# Patient Record
Sex: Male | Born: 1965 | Race: White | Hispanic: No | Marital: Married | Smoking: Never smoker
Health system: Southern US, Community
[De-identification: ages and names within clinical notes are randomized; demographics above are authoritative.]

## PROBLEM LIST (undated history)

## (undated) DIAGNOSIS — E119 Type 2 diabetes mellitus without complications: Secondary | ICD-10-CM

## (undated) DIAGNOSIS — R011 Cardiac murmur, unspecified: Secondary | ICD-10-CM

## (undated) DIAGNOSIS — R519 Headache, unspecified: Secondary | ICD-10-CM

## (undated) DIAGNOSIS — I10 Essential (primary) hypertension: Secondary | ICD-10-CM

## (undated) DIAGNOSIS — T4145XA Adverse effect of unspecified anesthetic, initial encounter: Secondary | ICD-10-CM

## (undated) DIAGNOSIS — M1990 Unspecified osteoarthritis, unspecified site: Secondary | ICD-10-CM

## (undated) DIAGNOSIS — T8859XA Other complications of anesthesia, initial encounter: Secondary | ICD-10-CM

## (undated) HISTORY — PX: SHOULDER ARTHROSCOPY: SHX128

## (undated) HISTORY — PX: KNEE ARTHROSCOPY: SUR90

---
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
## 1998-11-19 ENCOUNTER — Emergency Department (HOSPITAL_COMMUNITY): Admission: EM | Admit: 1998-11-19 | Discharge: 1998-11-19 | Payer: Self-pay | Admitting: Emergency Medicine

## 2017-08-03 HISTORY — PX: COLONOSCOPY: SHX174

## 2019-01-30 NOTE — Patient Instructions (Addendum)
Darin Harris     Your procedure is scheduled on: 02-06-2019   Report to [facility] Main  Entrance    Report to admitting at 925 AM   [place] 19 TEST ON 02-02-19  @ 10:00 AM, THIS TEST MUST BE DONE BEFORE SURGERY, COME TO [place]. ONCE YOUR COVID TEST IS COMPLETED, PLEASE BEGIN THE QUARANTINE INSTRUCTIONS AS OUTLINED IN YOUR HANDOUT.   Call this number if you have problems the morning of surgery [phone]    Remember: Do not eat food or drink liquids :After Midnight. BRUSH YOUR TEETH MORNING OF SURGERY AND RINSE YOUR MOUTH OUT, NO CHEWING GUM CANDY OR MINTS.     Take these medicines the morning of surgery with A SIP OF WATER: None                You may not have any metal on your body including hair pins and              piercings  Do not wear jewelry, make-up, lotions, powders or perfumes, deodorant                         Men may shave face and neck.   Do not bring valuables to the hospital. [place].  Contacts, dentures or bridgework may not be worn into surgery.  Leave suitcase in the car. After surgery it may be brought to your room.      _____________________________________________________________________  DO NOT TAKE ANY DIABETIC MEDICATIONS DAY OF YOUR SURGERY                How to Manage Your Diabetes Before and After Surgery  Why is it important to control my blood sugar before and after surgery? . Improving blood sugar levels before and after surgery helps healing and can limit problems. . A way of improving blood sugar control is eating a healthy diet by: o  Eating less sugar and carbohydrates o  Increasing activity/exercise o  Talking with your doctor about reaching your blood sugar goals . High blood sugars (greater than 180 mg/dL) can raise your risk of infections and slow your recovery, so you will need to focus on  controlling your diabetes during the weeks before surgery. . Make sure that the doctor who takes care of your diabetes knows about your planned surgery including the date and location.  How do I manage my blood sugar before surgery? . Check your blood sugar at least 4 times a day, starting 2 days before surgery, to make sure that the level is not too high or low. o Check your blood sugar the morning of your surgery when you wake up and every 2 hours until you get to the Short Stay unit. . If your blood sugar is less than 70 mg/dL, you will need to treat for low blood sugar: o Do not take insulin. o Treat a low blood sugar (less than 70 mg/dL) with  cup of clear juice (cranberry or apple), 4 glucose tablets, OR glucose gel. o Recheck blood sugar in 15 minutes after treatment (to make sure it is greater than 70 mg/dL). If your blood sugar is not greater than 70 mg/dL on recheck, call  [phone] for further instructions. . Report your blood sugar to the short stay nurse when you get to Short Stay.  . If you are admitted to the hospital after surgery: o Your blood sugar will be checked by the staff and you will probably be given insulin after surgery (instead of oral diabetes medicines) to make sure you have good blood sugar levels. o The goal for blood sugar control after surgery is 80-180 mg/dL.   WHAT DO I DO ABOUT MY DIABETES MEDICATION?  Marland Kitchen. Do not take oral diabetes medicines (pills) the morning of surgery.  . THE DAY  BEFORE SURGERY TAKE METFORMIN AS USUAL.        Reviewed and Endorsed by [facility] Patient Education Committee, August 2015             [facility] - Preparing for [facility] Before surgery, you can play an important role.  Because skin is not sterile, your skin needs to be as free of germs as possible.  You can reduce the number of germs on your skin by washing with CHG (chlorahexidine gluconate) soap before surgery.  CHG is an antiseptic cleaner which kills germs and  bonds with the skin to continue killing germs even after washing. Please DO NOT use if you have an allergy to CHG or antibacterial soaps.  If your skin becomes reddened/irritated stop using the CHG and inform your nurse when you arrive at Short Stay. Do not shave (including legs and underarms) for at least 48 hours prior to the first CHG shower.  You may shave your face/neck. Please follow these instructions carefully:  1.  Shower with CHG Soap the night before surgery and the  morning of Surgery.  2.  If you choose to wash your hair, wash your hair first as usual with your  normal  shampoo.  3.  After you shampoo, rinse your hair and body thoroughly to remove the  shampoo.                           4.  Use CHG as you would any other liquid soap.  You can apply chg directly  to the skin and wash                       Gently with a scrungie or clean washcloth.  5.  Apply the CHG Soap to your body ONLY FROM THE NECK DOWN.   Do not use on face/ open                           Wound or open sores. Avoid contact with eyes, ears mouth and genitals (private parts).                       Wash face,  Genitals (private parts) with your normal soap.             6.  Wash thoroughly, paying special attention to the area where your surgery  will be performed.  7.  Thoroughly rinse your body with warm water from the neck down.  8.  DO NOT shower/wash with your normal soap after using and rinsing off  the CHG Soap.                9.  Pat yourself dry with a clean towel.  10.  Wear clean pajamas.            11.  Place clean sheets on your bed the night of your first shower and do not  sleep with pets. Day of Surgery : Do not apply any lotions/deodorants the morning of surgery.  Please wear clean clothes to the hospital/surgery center.  FAILURE TO FOLLOW THESE INSTRUCTIONS MAY RESULT IN THE CANCELLATION OF YOUR SURGERY PATIENT SIGNATURE_________________________________  NURSE  SIGNATURE__________________________________  ________________________________________________________________________

## 2019-01-30 NOTE — Progress Notes (Signed)
NEED ORDERS IN Epic FOR 7-6 SURGERY. PRE OP IS 01-30-09 AT 800 AM.

## 2019-01-31 ENCOUNTER — Encounter (HOSPITAL_COMMUNITY)
Admission: RE | Admit: 2019-01-31 | Discharge: 2019-01-31 | Disposition: A | Discharge status: Home / Self Care | Payer: BC Managed Care – PPO | Source: Ambulatory Visit | Attending: Orthopedic Surgery | Admitting: Orthopedic Surgery

## 2019-01-31 ENCOUNTER — Encounter (HOSPITAL_COMMUNITY): Payer: Self-pay

## 2019-01-31 ENCOUNTER — Encounter (INDEPENDENT_AMBULATORY_CARE_PROVIDER_SITE_OTHER): Payer: Self-pay

## 2019-01-31 ENCOUNTER — Other Ambulatory Visit: Payer: Self-pay

## 2019-01-31 DIAGNOSIS — Z01818 Encounter for other preprocedural examination: Secondary | ICD-10-CM | POA: Diagnosis present

## 2019-01-31 HISTORY — DX: Unspecified osteoarthritis, unspecified site: M19.90

## 2019-01-31 HISTORY — DX: Essential (primary) hypertension: I10

## 2019-01-31 HISTORY — DX: Cardiac murmur, unspecified: R01.1

## 2019-01-31 HISTORY — DX: Other complications of anesthesia, initial encounter: T88.59XA

## 2019-01-31 HISTORY — DX: Headache, unspecified: R51.9

## 2019-01-31 HISTORY — DX: Type 2 diabetes mellitus without complications: E11.9

## 2019-01-31 LAB — CBC
HCT: 50.7 % (ref 39.0–52.0)
Hemoglobin: 16.8 g/dL (ref 13.0–17.0)
MCH: 29.3 pg (ref 26.0–34.0)
MCHC: 33.1 g/dL (ref 30.0–36.0)
MCV: 88.3 fL (ref 80.0–100.0)
Platelets: 277 10*3/uL (ref 150–400)
RBC: 5.74 MIL/uL (ref 4.22–5.81)
RDW: 12.1 % (ref 11.5–15.5)
WBC: 6 10*3/uL (ref 4.0–10.5)
nRBC: 0 % (ref 0.0–0.2)

## 2019-01-31 LAB — SURGICAL PCR SCREEN
MRSA, PCR: NEGATIVE
Staphylococcus aureus: NEGATIVE

## 2019-01-31 LAB — BASIC METABOLIC PANEL
Anion gap: 8 (ref 5–15)
BUN: 24 mg/dL — ABNORMAL HIGH (ref 6–20)
CO2: 26 mmol/L (ref 22–32)
Calcium: 9.1 mg/dL (ref 8.9–10.3)
Chloride: 106 mmol/L (ref 98–111)
Creatinine, Ser: 0.97 mg/dL (ref 0.61–1.24)
GFR calc Af Amer: >60 mL/min (ref >60)
GFR calc non Af Amer: >60 mL/min (ref >60)
Glucose, Bld: 110 mg/dL — ABNORMAL HIGH (ref 70–99)
Potassium: 4.4 mmol/L (ref 3.5–5.1)
Sodium: 140 mmol/L (ref 135–145)

## 2019-01-31 LAB — HEMOGLOBIN A1C
Hgb A1c MFr Bld: 5.7 % — ABNORMAL HIGH (ref 4.8–5.6)
Mean Plasma Glucose: 116.89 mg/dL

## 2019-01-31 LAB — GLUCOSE, CAPILLARY: Glucose-Capillary: 98 mg/dL (ref 70–99)

## 2019-01-31 NOTE — H&P (Signed)
Patient's anticipated LOS is less than 2 midnights, meeting these requirements: - Younger than [id number] - Lives within 1 hour of care - Has a competent adult at home to recover with post-op recover - NO history of  - Chronic pain requiring opiods  - Diabetes  - Coronary Artery Disease  - Heart failure  - Heart attack  - Stroke  - DVT/VTE  - Cardiac arrhythmia  - Respiratory Failure/COPD  - Renal failure  - Anemia  - Advanced Liver disease       Darin Harris is an 53 y.o. male.    Chief Complaint: right knee pain  HPI: Pt is a 53 y.o. male complaining of right knee pain for multiple years. Pain had continually increased since the beginning. X-rays in the clinic show end-stage arthritic changes of the right knee. Pt has tried various conservative treatments which have failed to alleviate their symptoms, including injections and therapy. Various options are discussed with the patient. Risks, benefits and expectations were discussed with the patient. Patient understand the risks, benefits and expectations and wishes to proceed with surgery.   PCP:  Tracey HarriesBouska, David, MD  D/C Plans: Home  PMH: Past Medical History:  Diagnosis Date  . Arthritis   . Complication of anesthesia    Vertigo  . Diabetes mellitus without complication (HCC)   . Headache   . Heart murmur   . Hypertension     PSH: Past Surgical History:  Procedure Laterality Date  . COLONOSCOPY  2019  . KNEE ARTHROSCOPY Bilateral   . SHOULDER ARTHROSCOPY Bilateral     Social History:  reports that he has never smoked. He has never used smokeless tobacco. He reports that he does not drink alcohol or use drugs.  Allergies:  Allergies  Allergen Reactions  . Oxycodone Nausea And Vomiting    Medications: No current facility-administered medications for this encounter.    Current Outpatient Medications  Medication Sig Dispense Refill  . metFORMIN (GLUCOPHAGE-XR) 500 MG 24 hr tablet Take 500 mg by mouth 2  (two) times a day.      Results for orders placed or performed during the hospital encounter of 01/31/19 (from the past 48 hour(s))  Glucose, capillary     Status: None   Collection Time: 01/31/19  8:26 AM  Result Value Ref Range   Glucose-Capillary 98 70 - 99 mg/dL  Surgical pcr screen     Status: None   Collection Time: 01/31/19  9:22 AM   Specimen: Nasal Mucosa; Nasal Swab  Result Value Ref Range   MRSA, PCR NEGATIVE NEGATIVE   Staphylococcus aureus NEGATIVE NEGATIVE    Comment: (NOTE) The Xpert SA Assay (FDA approved for NASAL specimens in patients 51 years of age and older), is one component of a comprehensive surveillance program. It is not intended to diagnose infection nor to guide or monitor treatment. Performed at [facility], [address] [place], [place] [address]   Hemoglobin A1c     Status: Abnormal   Collection Time: 01/31/19  9:22 AM  Result Value Ref Range   Hgb A1c MFr Bld 5.7 (H) 4.8 - 5.6 %    Comment: (NOTE) Pre diabetes:          5.7%-6.4% Diabetes:              >6.4% Glycemic control for   <7.0% adults with diabetes    Mean Plasma Glucose 116.89 mg/dL    Comment: Performed at [facility], [address] [place], [place] [address]  Basic metabolic panel     Status: Abnormal   Collection Time: 01/31/19  9:22 AM  Result Value Ref Range   Sodium 140 135 - 145 mmol/L   Potassium 4.4 3.5 - 5.1 mmol/L   Chloride 106 98 - 111 mmol/L   CO2 26 22 - 32 mmol/L   Glucose, Bld 110 (H) 70 - 99 mg/dL   BUN 24 (H) 6 - 20 mg/dL   Creatinine, Ser 0.97 0.61 - 1.24 mg/dL   Calcium 9.1 8.9 - 10.3 mg/dL   GFR calc non Af Amer >60 >60 mL/min   GFR calc Af Amer >60 >60 mL/min   Anion gap 8 5 - 15    Comment: Performed at [facility], [place] [address] [place], [place] [address]  CBC     Status: None   Collection Time: 01/31/19  9:22 AM  Result Value Ref Range   WBC 6.0 4.0 - 10.5 K/uL   RBC 5.74 4.22 - 5.81 MIL/uL    Hemoglobin 16.8 13.0 - 17.0 g/dL   HCT 50.7 39.0 - 52.0 %   MCV 88.3 80.0 - 100.0 fL   MCH 29.3 26.0 - 34.0 pg   MCHC 33.1 30.0 - 36.0 g/dL   RDW 12.1 11.5 - 15.5 %   Platelets 277 150 - 400 K/uL   nRBC 0.0 0.0 - 0.2 %    Comment: Performed at [facility], [place] [address] [place], [place] [address]   No results found.  ROS: Pain with rom of the right lower extremity  Physical Exam: Alert and oriented 53 y.o. male in no acute distress Cranial nerves 2-12 intact Cervical spine: full rom with no tenderness, nv intact distally Chest: active breath sounds bilaterally, no wheeze rhonchi or rales Heart: regular rate and rhythm, no murmur Abd: non tender non distended with active bowel sounds Hip is stable with rom  Right knee medial and lateral joint line tenderness nv intact distally Antalgic gait  Assessment/Plan Assessment: right knee end stage osteoarthritis  Plan:  Patient will undergo a right total knee by Dr. Veverly Fells at [facility]. Risks benefits and expectations were discussed with the patient. Patient understand risks, benefits and expectations and wishes to proceed. Preoperative templating of the joint replacement has been completed, documented, and submitted to the Operating Room personnel in order to optimize intra-operative equipment management.   Merla Riches PA-C, MPAS [facility] is now [employer] [address] [place], [place], [place] [address] Phone: [phone] [email or web address] Facebook  [employer]

## 2019-02-02 ENCOUNTER — Other Ambulatory Visit (HOSPITAL_COMMUNITY)
Admission: RE | Admit: 2019-02-02 | Discharge: 2019-02-02 | Disposition: A | Discharge status: Home / Self Care | Payer: BC Managed Care – PPO | Source: Ambulatory Visit | Attending: Orthopedic Surgery | Admitting: Orthopedic Surgery

## 2019-02-02 DIAGNOSIS — Z1159 Encounter for screening for other viral diseases: Secondary | ICD-10-CM | POA: Insufficient documentation

## 2019-02-02 LAB — SARS CORONAVIRUS 2 (TAT 6-24 HRS): SARS Coronavirus 2: NEGATIVE

## 2019-02-06 ENCOUNTER — Other Ambulatory Visit: Payer: Self-pay

## 2019-02-06 ENCOUNTER — Encounter (HOSPITAL_COMMUNITY): Payer: Self-pay

## 2019-02-06 ENCOUNTER — Observation Stay (HOSPITAL_COMMUNITY)
Admission: RE | Admit: 2019-02-06 | Discharge: 2019-02-07 | Disposition: A | Discharge status: Home / Self Care | Payer: BC Managed Care – PPO | Attending: Orthopedic Surgery | Admitting: Orthopedic Surgery

## 2019-02-06 ENCOUNTER — Inpatient Hospital Stay (HOSPITAL_COMMUNITY): Payer: BC Managed Care – PPO | Admitting: Anesthesiology

## 2019-02-06 ENCOUNTER — Inpatient Hospital Stay (HOSPITAL_COMMUNITY): Payer: BC Managed Care – PPO | Admitting: Physician Assistant

## 2019-02-06 ENCOUNTER — Encounter (HOSPITAL_COMMUNITY)
Admission: RE | Disposition: A | Discharge status: Home / Self Care | Payer: Self-pay | Source: Home / Self Care | Attending: Orthopedic Surgery

## 2019-02-06 ENCOUNTER — Observation Stay (HOSPITAL_COMMUNITY): Payer: BC Managed Care – PPO

## 2019-02-06 DIAGNOSIS — I10 Essential (primary) hypertension: Secondary | ICD-10-CM | POA: Diagnosis not present

## 2019-02-06 DIAGNOSIS — E119 Type 2 diabetes mellitus without complications: Secondary | ICD-10-CM | POA: Diagnosis not present

## 2019-02-06 DIAGNOSIS — Z6835 Body mass index (BMI) 35.0-35.9, adult: Secondary | ICD-10-CM | POA: Diagnosis not present

## 2019-02-06 DIAGNOSIS — E669 Obesity, unspecified: Secondary | ICD-10-CM | POA: Diagnosis not present

## 2019-02-06 DIAGNOSIS — Z79899 Other long term (current) drug therapy: Secondary | ICD-10-CM | POA: Diagnosis not present

## 2019-02-06 DIAGNOSIS — Z7984 Long term (current) use of oral hypoglycemic drugs: Secondary | ICD-10-CM | POA: Diagnosis not present

## 2019-02-06 DIAGNOSIS — M1711 Unilateral primary osteoarthritis, right knee: Principal | ICD-10-CM | POA: Insufficient documentation

## 2019-02-06 DIAGNOSIS — Z96651 Presence of right artificial knee joint: Secondary | ICD-10-CM

## 2019-02-06 HISTORY — PX: TOTAL KNEE ARTHROPLASTY: SHX125

## 2019-02-06 LAB — GLUCOSE, CAPILLARY
Glucose-Capillary: 103 mg/dL — ABNORMAL HIGH (ref 70–99)
Glucose-Capillary: 110 mg/dL — ABNORMAL HIGH (ref 70–99)

## 2019-02-06 SURGERY — ARTHROPLASTY, KNEE, TOTAL
Anesthesia: Spinal | Laterality: Right

## 2019-02-06 MED ORDER — FENTANYL CITRATE (PF) 100 MCG/2ML IJ SOLN
50.0000 ug | INTRAMUSCULAR | Status: DC
  Administered 2019-02-06: 50 ug via INTRAVENOUS
  Filled 2019-02-06: qty 2

## 2019-02-06 MED ORDER — BISACODYL 10 MG RE SUPP: 10.0000 mg | Freq: Every day | RECTAL | Status: DC | PRN

## 2019-02-06 MED ORDER — MENTHOL 3 MG MT LOZG: 1.0000 | LOZENGE | OROMUCOSAL | Status: DC | PRN

## 2019-02-06 MED ORDER — PROMETHAZINE HCL 25 MG/ML IJ SOLN: 6.2500 mg | INTRAMUSCULAR | Status: DC | PRN

## 2019-02-06 MED ORDER — MIDAZOLAM HCL 2 MG/2ML IJ SOLN
1.0000 mg | INTRAMUSCULAR | Status: DC
  Administered 2019-02-06: 11:00:00 2 mg via INTRAVENOUS
  Filled 2019-02-06: qty 2

## 2019-02-06 MED ORDER — DEXAMETHASONE SODIUM PHOSPHATE 10 MG/ML IJ SOLN
INTRAMUSCULAR | Status: DC | PRN
  Administered 2019-02-06: 8 mg via INTRAVENOUS

## 2019-02-06 MED ORDER — LACTATED RINGERS IV SOLN
INTRAVENOUS | Status: DC
  Administered 2019-02-06 (×2): via INTRAVENOUS

## 2019-02-06 MED ORDER — SODIUM CHLORIDE 0.9 % IV SOLN
INTRAVENOUS | Status: DC
  Administered 2019-02-06: 18:00:00 via INTRAVENOUS

## 2019-02-06 MED ORDER — CHLORHEXIDINE GLUCONATE 4 % EX LIQD: 60.0000 mL | Freq: Once | CUTANEOUS | Status: DC

## 2019-02-06 MED ORDER — 0.9 % SODIUM CHLORIDE (POUR BTL) OPTIME
TOPICAL | Status: DC | PRN
  Administered 2019-02-06: 12:00:00 1000 mL

## 2019-02-06 MED ORDER — SODIUM CHLORIDE 0.9 % IR SOLN
Status: DC | PRN
  Administered 2019-02-06: 3000 mL

## 2019-02-06 MED ORDER — OXYCODONE-ACETAMINOPHEN 5-325 MG PO TABS: 1.0000 | ORAL_TABLET | ORAL | 0 refills | Status: AC | PRN

## 2019-02-06 MED ORDER — INSULIN ASPART 100 UNIT/ML ~~LOC~~ SOLN
0.0000 [IU] | Freq: Three times a day (TID) | SUBCUTANEOUS | Status: DC
  Administered 2019-02-07: 08:00:00 2 [IU] via SUBCUTANEOUS

## 2019-02-06 MED ORDER — ROPIVACAINE HCL 7.5 MG/ML IJ SOLN
INTRAMUSCULAR | Status: DC | PRN
  Administered 2019-02-06: 20 mL via PERINEURAL

## 2019-02-06 MED ORDER — ONDANSETRON HCL 4 MG/2ML IJ SOLN
INTRAMUSCULAR | Status: AC
  Filled 2019-02-06: qty 2

## 2019-02-06 MED ORDER — EPHEDRINE 5 MG/ML INJ
INTRAVENOUS | Status: AC
  Filled 2019-02-06: qty 10

## 2019-02-06 MED ORDER — ACETAMINOPHEN 325 MG PO TABS: 325.0000 mg | ORAL_TABLET | Freq: Four times a day (QID) | ORAL | Status: DC | PRN

## 2019-02-06 MED ORDER — ASPIRIN 81 MG PO CHEW
81.0000 mg | CHEWABLE_TABLET | Freq: Two times a day (BID) | ORAL | Status: DC
  Administered 2019-02-06 – 2019-02-07 (×2): 81 mg via ORAL
  Filled 2019-02-06 (×2): qty 1

## 2019-02-06 MED ORDER — TRANEXAMIC ACID-NACL 1000-0.7 MG/100ML-% IV SOLN
1000.0000 mg | Freq: Once | INTRAVENOUS | Status: AC
  Administered 2019-02-06: 1000 mg via INTRAVENOUS
  Filled 2019-02-06: qty 100

## 2019-02-06 MED ORDER — METOCLOPRAMIDE HCL 5 MG PO TABS: 5.0000 mg | ORAL_TABLET | Freq: Three times a day (TID) | ORAL | Status: DC | PRN

## 2019-02-06 MED ORDER — POLYETHYLENE GLYCOL 3350 17 G PO PACK: 17.0000 g | PACK | Freq: Every day | ORAL | Status: DC | PRN

## 2019-02-06 MED ORDER — ONDANSETRON HCL 4 MG PO TABS: 4.0000 mg | ORAL_TABLET | Freq: Four times a day (QID) | ORAL | Status: DC | PRN

## 2019-02-06 MED ORDER — FENTANYL CITRATE (PF) 100 MCG/2ML IJ SOLN
25.0000 ug | INTRAMUSCULAR | Status: DC | PRN
  Administered 2019-02-06 (×3): 50 ug via INTRAVENOUS

## 2019-02-06 MED ORDER — METOCLOPRAMIDE HCL 5 MG/ML IJ SOLN: 5.0000 mg | Freq: Three times a day (TID) | INTRAMUSCULAR | Status: DC | PRN

## 2019-02-06 MED ORDER — TRAMADOL HCL 50 MG PO TABS
50.0000 mg | ORAL_TABLET | Freq: Four times a day (QID) | ORAL | Status: DC
  Administered 2019-02-06 – 2019-02-07 (×4): 50 mg via ORAL
  Filled 2019-02-06 (×4): qty 1

## 2019-02-06 MED ORDER — DOCUSATE SODIUM 100 MG PO CAPS
100.0000 mg | ORAL_CAPSULE | Freq: Two times a day (BID) | ORAL | Status: DC
  Administered 2019-02-06 – 2019-02-07 (×2): 100 mg via ORAL
  Filled 2019-02-06 (×2): qty 1

## 2019-02-06 MED ORDER — METFORMIN HCL ER 500 MG PO TB24
500.0000 mg | ORAL_TABLET | Freq: Two times a day (BID) | ORAL | Status: DC
  Administered 2019-02-07: 500 mg via ORAL
  Filled 2019-02-06: qty 1

## 2019-02-06 MED ORDER — HYDROMORPHONE HCL 1 MG/ML IJ SOLN
0.5000 mg | INTRAMUSCULAR | Status: DC | PRN
  Administered 2019-02-07: 01:00:00 1 mg via INTRAVENOUS
  Filled 2019-02-06: qty 1

## 2019-02-06 MED ORDER — ONDANSETRON HCL 4 MG PO TABS: 4.0000 mg | ORAL_TABLET | Freq: Three times a day (TID) | ORAL | 1 refills | Status: AC | PRN

## 2019-02-06 MED ORDER — PROPOFOL 500 MG/50ML IV EMUL
INTRAVENOUS | Status: DC | PRN
  Administered 2019-02-06: 50 ug/kg/min via INTRAVENOUS

## 2019-02-06 MED ORDER — PHENOL 1.4 % MT LIQD
1.0000 | OROMUCOSAL | Status: DC | PRN
  Filled 2019-02-06: qty 177

## 2019-02-06 MED ORDER — PROPOFOL 10 MG/ML IV BOLUS
INTRAVENOUS | Status: AC
  Filled 2019-02-06: qty 40

## 2019-02-06 MED ORDER — BUPIVACAINE IN DEXTROSE 0.75-8.25 % IT SOLN
INTRATHECAL | Status: DC | PRN
  Administered 2019-02-06: 1.6 mL via INTRATHECAL

## 2019-02-06 MED ORDER — ONDANSETRON HCL 4 MG/2ML IJ SOLN
4.0000 mg | Freq: Four times a day (QID) | INTRAMUSCULAR | Status: DC | PRN
  Administered 2019-02-07 (×2): 4 mg via INTRAVENOUS
  Filled 2019-02-06 (×2): qty 2

## 2019-02-06 MED ORDER — INSULIN ASPART 100 UNIT/ML ~~LOC~~ SOLN: 0.0000 [IU] | Freq: Every day | SUBCUTANEOUS | Status: DC

## 2019-02-06 MED ORDER — METHOCARBAMOL 500 MG IVPB - SIMPLE MED
500.0000 mg | Freq: Four times a day (QID) | INTRAVENOUS | Status: DC | PRN
  Administered 2019-02-06: 500 mg via INTRAVENOUS
  Filled 2019-02-06: qty 50

## 2019-02-06 MED ORDER — METHOCARBAMOL 500 MG PO TABS
500.0000 mg | ORAL_TABLET | Freq: Four times a day (QID) | ORAL | Status: DC | PRN
  Administered 2019-02-06 – 2019-02-07 (×2): 500 mg via ORAL
  Filled 2019-02-06 (×2): qty 1

## 2019-02-06 MED ORDER — EPHEDRINE SULFATE-NACL 50-0.9 MG/10ML-% IV SOSY
PREFILLED_SYRINGE | INTRAVENOUS | Status: DC | PRN
  Administered 2019-02-06: 5 mg via INTRAVENOUS

## 2019-02-06 MED ORDER — TRANEXAMIC ACID-NACL 1000-0.7 MG/100ML-% IV SOLN
1000.0000 mg | INTRAVENOUS | Status: AC
  Administered 2019-02-06: 1000 mg via INTRAVENOUS
  Filled 2019-02-06: qty 100

## 2019-02-06 MED ORDER — PROPOFOL 10 MG/ML IV BOLUS
INTRAVENOUS | Status: DC | PRN
  Administered 2019-02-06: 20 mg via INTRAVENOUS

## 2019-02-06 MED ORDER — METHOCARBAMOL 500 MG IVPB - SIMPLE MED
INTRAVENOUS | Status: AC
  Administered 2019-02-06: 500 mg via INTRAVENOUS
  Filled 2019-02-06: qty 50

## 2019-02-06 MED ORDER — DEXAMETHASONE SODIUM PHOSPHATE 10 MG/ML IJ SOLN
INTRAMUSCULAR | Status: AC
  Filled 2019-02-06: qty 1

## 2019-02-06 MED ORDER — ONDANSETRON HCL 4 MG/2ML IJ SOLN
INTRAMUSCULAR | Status: DC | PRN
  Administered 2019-02-06: 4 mg via INTRAVENOUS

## 2019-02-06 MED ORDER — CEFAZOLIN SODIUM-DEXTROSE 2-4 GM/100ML-% IV SOLN
2.0000 g | Freq: Four times a day (QID) | INTRAVENOUS | Status: AC
  Administered 2019-02-06 (×2): 2 g via INTRAVENOUS
  Filled 2019-02-06 (×2): qty 100

## 2019-02-06 MED ORDER — ASPIRIN 81 MG PO CHEW: 81.0000 mg | CHEWABLE_TABLET | Freq: Two times a day (BID) | ORAL | 0 refills | Status: AC

## 2019-02-06 MED ORDER — FENTANYL CITRATE (PF) 100 MCG/2ML IJ SOLN
INTRAMUSCULAR | Status: AC
  Administered 2019-02-06: 50 ug via INTRAVENOUS
  Filled 2019-02-06: qty 2

## 2019-02-06 MED ORDER — OXYCODONE HCL 5 MG PO TABS
5.0000 mg | ORAL_TABLET | ORAL | Status: DC | PRN
  Administered 2019-02-07: 10 mg via ORAL
  Filled 2019-02-06: qty 2

## 2019-02-06 MED ORDER — METHOCARBAMOL 500 MG PO TABS: 500.0000 mg | ORAL_TABLET | Freq: Four times a day (QID) | ORAL | 1 refills | Status: AC | PRN

## 2019-02-06 MED ORDER — FERROUS SULFATE 325 (65 FE) MG PO TABS
325.0000 mg | ORAL_TABLET | Freq: Three times a day (TID) | ORAL | Status: DC
  Administered 2019-02-06 – 2019-02-07 (×3): 325 mg via ORAL
  Filled 2019-02-06 (×3): qty 1

## 2019-02-06 MED ORDER — CEFAZOLIN SODIUM-DEXTROSE 2-4 GM/100ML-% IV SOLN
2.0000 g | INTRAVENOUS | Status: AC
  Administered 2019-02-06: 2 g via INTRAVENOUS
  Filled 2019-02-06: qty 100

## 2019-02-06 MED ORDER — WATER FOR IRRIGATION, STERILE IR SOLN
Status: DC | PRN
  Administered 2019-02-06: 2000 mL

## 2019-02-06 MED ORDER — FENTANYL CITRATE (PF) 100 MCG/2ML IJ SOLN
INTRAMUSCULAR | Status: AC
  Administered 2019-02-06: 15:00:00 50 ug via INTRAVENOUS
  Filled 2019-02-06: qty 2

## 2019-02-06 SURGICAL SUPPLY — 63 items
BAG SPEC THK2 15X12 ZIP CLS (MISCELLANEOUS) ×1
BAG ZIPLOCK 12X15 (MISCELLANEOUS) ×3 IMPLANT
BIT DRILL 1.6MX128 (BIT) ×2 IMPLANT
BIT DRILL 1.6MX128MM (BIT) ×1
BLADE SAG 18X100X1.27 (BLADE) ×3 IMPLANT
BLADE SAW SGTL 13X75X1.27 (BLADE) ×3 IMPLANT
BNDG CMPR 82X61 PLY HI ABS (GAUZE/BANDAGES/DRESSINGS) ×1
BNDG CMPR MED 10X6 ELC LF (GAUZE/BANDAGES/DRESSINGS) ×1
BNDG CMPR MED 15X6 ELC VLCR LF (GAUZE/BANDAGES/DRESSINGS) ×1
BNDG CONFORM 6X.82 1P STRL (GAUZE/BANDAGES/DRESSINGS) ×3 IMPLANT
BNDG ELASTIC 6X10 VLCR STRL LF (GAUZE/BANDAGES/DRESSINGS) ×3 IMPLANT
BNDG ELASTIC 6X15 VLCR STRL LF (GAUZE/BANDAGES/DRESSINGS) ×3 IMPLANT
BNDG GAUZE ELAST 4 BULKY (GAUZE/BANDAGES/DRESSINGS) ×3 IMPLANT
BOWL SMART MIX CTS (DISPOSABLE) ×3 IMPLANT
CEMENT HV SMART SET (Cement) ×6 IMPLANT
CEMENT TIBIA MBT SIZE 5 (Knees) ×1 IMPLANT
CLOSURE WOUND 1/2 X4 (GAUZE/BANDAGES/DRESSINGS) ×2
COVER SURGICAL LIGHT HANDLE (MISCELLANEOUS) ×3 IMPLANT
COVER WAND RF STERILE (DRAPES) IMPLANT
CUFF TOURN SGL QUICK 34 (TOURNIQUET CUFF) ×3
CUFF TOURN SGL QUICK 42 (TOURNIQUET CUFF) ×3 IMPLANT
CUFF TRNQT CYL 34X4.125X (TOURNIQUET CUFF) ×1 IMPLANT
DRAPE SHEET LG 3/4 BI-LAMINATE (DRAPES) ×3 IMPLANT
DRAPE U-SHAPE 47X51 STRL (DRAPES) ×3 IMPLANT
DRSG ADAPTIC 3X8 NADH LF (GAUZE/BANDAGES/DRESSINGS) ×3 IMPLANT
DRSG PAD ABDOMINAL 8X10 ST (GAUZE/BANDAGES/DRESSINGS) ×3 IMPLANT
DURAPREP 26ML APPLICATOR (WOUND CARE) ×3 IMPLANT
ELECT REM PT RETURN 15FT ADLT (MISCELLANEOUS) ×3 IMPLANT
FEMUR SIGMA PS SZ 5.0 R (Femur) ×3 IMPLANT
GAUZE SPONGE 4X4 12PLY STRL (GAUZE/BANDAGES/DRESSINGS) ×3 IMPLANT
GLOVE BIOGEL PI ORTHO PRO 7.5 (GLOVE) ×2
GLOVE BIOGEL PI ORTHO PRO SZ8 (GLOVE) ×2
GLOVE ORTHO TXT STRL SZ7.5 (GLOVE) ×3 IMPLANT
GLOVE PI ORTHO PRO STRL 7.5 (GLOVE) ×1 IMPLANT
GLOVE PI ORTHO PRO STRL SZ8 (GLOVE) ×1 IMPLANT
GLOVE SURG ORTHO 8.5 STRL (GLOVE) ×6 IMPLANT
GOWN STRL REIN XL XLG (GOWN DISPOSABLE) ×6 IMPLANT
HANDPIECE INTERPULSE COAX TIP (DISPOSABLE) ×3
HOLDER FOLEY CATH W/STRAP (MISCELLANEOUS) IMPLANT
IMMOBILIZER KNEE 20 (SOFTGOODS) ×3
IMMOBILIZER KNEE 20 THIGH 36 (SOFTGOODS) ×1 IMPLANT
KIT TURNOVER KIT A (KITS) IMPLANT
MANIFOLD NEPTUNE II (INSTRUMENTS) ×3 IMPLANT
NS IRRIG 1000ML POUR BTL (IV SOLUTION) ×3 IMPLANT
PACK TOTAL KNEE CUSTOM (KITS) ×3 IMPLANT
PATELLA DOME PFC 38MM (Knees) ×3 IMPLANT
PIN STEINMAN FIXATION KNEE (PIN) ×3 IMPLANT
PLATE ROT INSERT 12.5MM (Plate) ×3 IMPLANT
PROTECTOR NERVE ULNAR (MISCELLANEOUS) ×3 IMPLANT
SET HNDPC FAN SPRY TIP SCT (DISPOSABLE) ×1 IMPLANT
STAPLER VISISTAT 35W (STAPLE) IMPLANT
STRIP CLOSURE SKIN 1/2X4 (GAUZE/BANDAGES/DRESSINGS) ×4 IMPLANT
SUT MNCRL AB 3-0 PS2 18 (SUTURE) ×3 IMPLANT
SUT VIC AB 0 CT1 36 (SUTURE) ×3 IMPLANT
SUT VIC AB 1 CT1 36 (SUTURE) ×9 IMPLANT
SUT VIC AB 2-0 CT1 27 (SUTURE) ×3
SUT VIC AB 2-0 CT1 TAPERPNT 27 (SUTURE) ×1 IMPLANT
TAPE STRIPS DRAPE STRL (GAUZE/BANDAGES/DRESSINGS) ×3 IMPLANT
TIBIA MBT CEMENT SIZE 5 (Knees) ×3 IMPLANT
TRAY FOLEY MTR SLVR 16FR STAT (SET/KITS/TRAYS/PACK) ×3 IMPLANT
WATER STERILE IRR 1000ML POUR (IV SOLUTION) ×6 IMPLANT
WRAP KNEE MAXI GEL POST OP (GAUZE/BANDAGES/DRESSINGS) ×3 IMPLANT
YANKAUER SUCT BULB TIP 10FT TU (MISCELLANEOUS) ×3 IMPLANT

## 2019-02-06 NOTE — Evaluation (Signed)
Physical Therapy Evaluation Patient Details Name: Darin Harris MRN: [id number] DOB: March 23, 1966 Today's Date: 02/06/2019   History of Present Illness  s/p R TKA. PMH: HTN, DM  Clinical Impression  Pt is s/p TKA resulting in the deficits listed below (see PT Problem List).   Pt with lots of questions regarding activity progression. Reviewed quad sets and ankle pumps, knee precautions and importance of not overdoing it during initial stages of healing. Pt amb ~ 18' with RW and min assist, requiring incr time for all functional mobility tasks today. Continue PT in acute setting.  Pt will benefit from skilled PT to increase their independence and safety with mobility to allow discharge to the venue listed below.      Follow Up Recommendations Follow surgeon's recommendation for DC plan and follow-up therapies    Equipment Recommendations  Rolling walker with 5" wheels    Recommendations for Other Services       Precautions / Restrictions Precautions Precautions: Fall;Knee Required Braces or Orthoses: Knee Immobilizer - Right Restrictions Weight Bearing Restrictions: No Other Position/Activity Restrictions: WBAT      Mobility  Bed Mobility Overal bed mobility: Needs Assistance Bed Mobility: Supine to Sit     Supine to sit: Min assist     General bed mobility comments: assist with RLE  Transfers Overall transfer level: Needs assistance Equipment used: Rolling walker (2 wheeled) Transfers: Sit to/from Stand Sit to Stand: Min assist;From elevated surface         General transfer comment: cues for hand placement and RLE position  Ambulation/Gait Ambulation/Gait assistance: Min Web designer (Feet): 65 Feet Assistive device: Rolling walker (2 wheeled) Gait Pattern/deviations: Step-to pattern Gait velocity: decr   General Gait Details: cues for sequence and wt shift onto  UEs for pain control RLE  Stairs            Wheelchair Mobility     Modified Rankin (Stroke Patients Only)       Balance                                             Pertinent Vitals/Pain Pain Assessment: 0-10 Pain Score: 5  Pain Location: right knee Pain Descriptors / Indicators: Discomfort;Grimacing;Guarding Pain Intervention(s): Limited activity within patient's tolerance;Monitored during session;Repositioned    Home Living Family/patient expects to be discharged to:: Private residence Living Arrangements: Spouse/significant other Available Help at Discharge: Family Type of Home: House                Prior Function Level of Independence: Independent               Hand Dominance        Extremity/Trunk Assessment   Upper Extremity Assessment Upper Extremity Assessment: Overall WFL for tasks assessed    Lower Extremity Assessment Lower Extremity Assessment: RLE deficits/detail RLE Deficits / Details: ankle WFL, limited by post op pain       Communication   Communication: No difficulties  Cognition Arousal/Alertness: Awake/alert Behavior During Therapy: WFL for tasks assessed/performed Overall Cognitive Status: Within Functional Limits for tasks assessed                                        General Comments      Exercises Total Joint Exercises  Ankle Circles/Pumps: AROM;Both;10 reps Quad Sets: AROM;5 reps;Both   Assessment/Plan    PT Assessment Patient needs continued PT services  PT Problem List Decreased strength;Decreased range of motion;Decreased activity tolerance;Decreased mobility;Pain;Decreased knowledge of use of DME       PT Treatment Interventions DME instruction;Gait training;Functional mobility training;Therapeutic activities;Therapeutic exercise;Patient/family education;Stair training    PT Goals (Current goals can be found in the Care Plan section)  Acute Rehab PT Goals PT Goal Formulation: With patient Time For Goal Achievement: 02/13/19 Potential to  Achieve Goals: Good    Frequency 7X/week   Barriers to discharge        Co-evaluation               AM-PAC PT "6 Clicks" Mobility  Outcome Measure Help needed turning from your back to your side while in a flat bed without using bedrails?: A Little Help needed moving from lying on your back to sitting on the side of a flat bed without using bedrails?: A Little Help needed moving to and from a bed to a chair (including a wheelchair)?: A Little Help needed standing up from a chair using your arms (e.g., wheelchair or bedside chair)?: A Little Help needed to walk in hospital room?: A Little Help needed climbing 3-5 steps with a railing? : A Lot 6 Click Score: 17    End of Session Equipment Utilized During Treatment: Gait belt Activity Tolerance: Patient tolerated treatment well Patient left: in chair;with chair alarm set;with call bell/phone within reach Nurse Communication: Mobility status PT Visit Diagnosis: Difficulty in walking, not elsewhere classified (R26.2)    Time: [id number] PT Time Calculation (min) (ACUTE ONLY): 39 min   Charges:   PT Evaluation $PT Eval Low Complexity: 1 Low PT Treatments $Gait Training: 23-37 mins        Darin Harris, PT  Pager: [phone] Acute Rehab Dept [facility]): [phone]   02/06/2019   [facility],Darin [place] 02/06/2019, 7:09 PM

## 2019-02-06 NOTE — Op Note (Signed)
NAME: Joesph FillersBRADSHAW, Wah C. MEDICAL RECORD [id number] ACCOUNT [id number] DATE OF BIRTH:1965/10/10 FACILITY: [facility] LOCATION: [facility] PHYSICIAN:STEVEN Russ Halo. Melaine Mcphee, MD  OPERATIVE REPORT  DATE OF PROCEDURE:  02/06/2019  PREOPERATIVE DIAGNOSIS:  Right knee end-stage osteoarthritis.  POSTOPERATIVE DIAGNOSIS:  Right knee end-stage osteoarthritis.  PROCEDURE PERFORMED:  Right total knee arthroplasty using DePuy Sigma rotating platform prosthesis.  ATTENDING SURGEON:  Malon KindleSteven Ravenne Wayment, MD  ASSISTANT:  Konrad Felixhomas [facility] PA-C, who was scrubbed during the entire procedure and necessary for satisfactory completion of surgery.  ANESTHESIA:  Spinal anesthesia plus an adductor canal block were utilized.  ESTIMATED BLOOD LOSS:  Minimal.  FLUID REPLACEMENT:  1500 mL crystalloid.  INSTRUMENT COUNTS:  Correct.  COMPLICATIONS:  No complications.  ANTIBIOTICS:  Perioperative antibiotics were given.  TOURNIQUET TIME:  One hour and 20 minutes at 300 mmHg.  INDICATIONS:  The patient is a 53 year old male with worsening right knee pain secondary to end-stage arthritis.  The patient has failed all measures of conservative management and presents for operative treatment to restore function and eliminate pain  in the knee.  Informed consent obtained.  DESCRIPTION OF PROCEDURE:  After adequate level of anesthesia was achieved, the patient was positioned in the supine position, the right leg correctly identified.  Nonsterile tourniquet placed on proximal thigh.  Right leg was sterilely prepped and  draped in the usual manner down leg.  The left leg padded appropriately and secured to the table.  After sterile prep and drape of the right leg, time-out called, verifying correct patient, correct site.  We elevated the leg and exsanguinated with an  Esmarch bandage.  We then placed the knee in flexion performed a longitudinal midline incision.  We then performed a medial parapatellar arthrotomy.  We divided  lateral patellofemoral ligament and everted the patella, exposing the distal femur.  There  was a fully eburnated bone on the medial compartment and patellofemoral compartment.  We entered the distal femur with a step cut drill, placed intramedullary resection guide, resecting 10 mm of bone off of the right distal femur, set on 5 degrees of  valgus.  We then sized the femur to a size 5 anterior down and using the posterior femoral condyles as a reference, we next performed our anterior, posterior and chamfer cuts with a 401 block.  We then ACL, PCL, and meniscal tissues, subluxed the tibia  anteriorly.  We performed a perpendicular cut to the tibia with minimal posterior slope using the external alignment guide, resecting 4 mm off the affected medial side.  We removed posterior osteophytes off the posterior femoral condyle with the lamina  spreader and an osteotome.  We then went ahead and checked our flexion and extension gaps, which were symmetric at 10 possibly, to a 5.  We then completed our tibial preparation with the modular drill and keel punch.  There was eburnated bone and very  sclerotic bone on the medial side that we did wind up drilling to enhance cement fixation and interdigitation.  After completion of the tibial preparation and placement of the size 5 tibia, we went to the femur and did our box cut and placed the 5 right  femur and then a 10 poly insert in and reduced the knee.  We felt like we probably could get a 12.5 in.  We then resurfaced the patella going from 27 mm thickness down to 17 mm thickness and we used a 38 patellar button.  We drilled the lug holes for  that and placed the patellar  button in place and ranged the knee, which had normal patellar tracking with no-touch technique.  We irrigated thoroughly and removed all trial components.  We dried the bone well and then vacuum mixed high viscosity cement,   cemented the tibia, then femur, then patella, using a patellar clamp and  placing a 10 mm polyethylene trial and allowing the cement to harden before removing excess cement with quarter-inch curved osteotome.  Once we had a thorough inspection of all  compartments including posteriorly for any excess cement.  We placed a real 12.5 poly insert on the tibia and then reduced the femoral condyle, so we were happy with that ability to come to full extension also stability in flexion.  Patellar tracking was  perfect.  We irrigated thoroughly and closed the parapatellar arthrotomy with #1 Vicryl suture followed by 0 and 2-0 layered subcutaneous closure and 4-0 Monocryl for skin.  Steri-Strips applied followed by sterile dressing.    The patient tolerated surgery well.  AN/NUANCE  D:02/06/2019 T:02/06/2019 JOB:[id number]

## 2019-02-06 NOTE — Anesthesia Procedure Notes (Signed)
Procedure Name: MAC Date/Time: 02/06/2019 12:00 PM Performed by: Niel Hummer, CRNA Pre-anesthesia Checklist: Patient identified, Emergency Drugs available, Suction available and Patient being monitored Patient Re-evaluated:Patient Re-evaluated prior to induction Oxygen Delivery Method: Simple face mask

## 2019-02-06 NOTE — Progress Notes (Signed)
Assisted Dr. Brock with right, ultrasound guided, adductor canal block. Side rails up, monitors on throughout procedure. See vital signs in flow sheet. Tolerated Procedure well.  

## 2019-02-06 NOTE — Anesthesia Preprocedure Evaluation (Addendum)
Anesthesia Evaluation  Patient identified by MRN, date of birth, ID band Patient awake    Reviewed: Allergy & Precautions, NPO status , Patient's Chart, lab work & pertinent test results  History of Anesthesia Complications Negative for: history of anesthetic complications  Airway Mallampati: II  TM Distance: >3 FB Neck ROM: Full    Dental  (+) Dental Advisory Given, Missing,    Pulmonary neg pulmonary ROS,    breath sounds clear to auscultation       Cardiovascular hypertension (no meds), (-) angina Rhythm:Regular Rate:Normal     Neuro/Psych  Headaches,  Vertigo  negative psych ROS   GI/Hepatic negative GI ROS, Neg liver ROS,   Endo/Other  diabetes, Type 2, Oral Hypoglycemic Agents Obesity   Renal/GU negative Renal ROS     Musculoskeletal  (+) Arthritis ,   Abdominal   Peds  Hematology negative hematology ROS (+)   Anesthesia Other Findings   Reproductive/Obstetrics                            Anesthesia Physical Anesthesia Plan  ASA: II  Anesthesia Plan: Spinal   Post-op Pain Management:  Regional for Post-op pain   Induction:   PONV Risk Score and Plan: 1 and Treatment may vary due to age or medical condition and Propofol infusion  Airway Management Planned: Natural Airway and Simple Face Mask  Additional Equipment: None  Intra-op Plan:   Post-operative Plan:   Informed Consent: I have reviewed the patients History and Physical, chart, labs and discussed the procedure including the risks, benefits and alternatives for the proposed anesthesia with the patient or authorized representative who has indicated his/her understanding and acceptance.       Plan Discussed with: CRNA and Anesthesiologist  Anesthesia Plan Comments: (Labs reviewed, platelets acceptable. Discussed risks and benefits of spinal, including spinal/epidural hematoma, infection, failed block, and  PDPH. Patient expressed understanding and wished to proceed. )       Anesthesia Quick Evaluation

## 2019-02-06 NOTE — Anesthesia Postprocedure Evaluation (Signed)
Anesthesia Post Note  Patient: Darin Harris  Procedure(s) Performed: TOTAL KNEE ARTHROPLASTY (Right )     Patient location during evaluation: PACU Anesthesia Type: Spinal Level of consciousness: awake and alert Pain management: pain level controlled Vital Signs Assessment: post-procedure vital signs reviewed and stable Respiratory status: spontaneous breathing, respiratory function stable and patient connected to nasal cannula oxygen Cardiovascular status: blood pressure returned to baseline and stable Postop Assessment: spinal receding and no apparent nausea or vomiting Anesthetic complications: no    Last Vitals:  Vitals:   02/06/19 1445 02/06/19 1500  BP: (!) 101/59 121/82  Pulse: (!) 59 62  Resp: 16 14  Temp:    SpO2: 99% 100%    Last Pain:  Vitals:   02/06/19 1515  TempSrc:   PainSc: [place]

## 2019-02-06 NOTE — Interval H&P Note (Signed)
History and Physical Interval Note:  02/06/2019 11:36 AM  Darin Harris  has presented today for surgery, with the diagnosis of Roght knee end stage osteoarthritis.  The various methods of treatment have been discussed with the patient and family. After consideration of risks, benefits and other options for treatment, the patient has consented to  Procedure(s): TOTAL KNEE ARTHROPLASTY (Right) as a surgical intervention.  The patient's history has been reviewed, patient examined, no change in status, stable for surgery.  I have reviewed the patient's chart and labs.  Questions were answered to the patient's satisfaction.     Augustin Schooling

## 2019-02-06 NOTE — Anesthesia Procedure Notes (Signed)
Anesthesia Regional Block: Adductor canal block   Pre-Anesthetic Checklist: ,, timeout performed, Correct Patient, Correct Site, Correct Laterality, Correct Procedure, Correct Position, site marked, Risks and benefits discussed,  Surgical consent,  Pre-op evaluation,  At surgeon's request and post-op pain management  Laterality: Right  Prep: chloraprep       Needles:  Injection technique: Single-shot  Needle Type: Echogenic Needle     Needle Length: 10cm  Needle Gauge: 21     Additional Needles:   Narrative:  Start time: 02/06/2019 11:13 AM End time: 02/06/2019 11:17 AM Injection made incrementally with aspirations every 5 mL.  Performed by: Personally  Anesthesiologist: Audry Pili, MD  Additional Notes: No pain on injection. No increased resistance to injection. Injection made in 5cc increments. Good needle visualization. Patient tolerated the procedure well.

## 2019-02-06 NOTE — Anesthesia Procedure Notes (Signed)
Spinal  Patient location during procedure: OR Start time: 02/06/2019 12:00 PM End time: 02/06/2019 12:03 PM Staffing Resident/CRNA: Niel Hummer, CRNA Performed: resident/CRNA  Preanesthetic Checklist Completed: patient identified, surgical consent, pre-op evaluation, IV checked, risks and benefits discussed and monitors and equipment checked Spinal Block Patient position: sitting Prep: DuraPrep Patient monitoring: heart rate, continuous pulse ox and blood pressure Approach: midline Location: L3-4 Injection technique: single-shot Needle Needle type: Pencan  Needle gauge: 24 G

## 2019-02-06 NOTE — Brief Op Note (Signed)
02/06/2019  1:54 PM  PATIENT:  Darin Harris  53 y.o. male  PRE-OPERATIVE DIAGNOSIS:  Right knee end stage osteoarthritis  POST-OPERATIVE DIAGNOSIS:  Roght knee end stage osteoarthritis  PROCEDURE:  Procedure(s): TOTAL KNEE ARTHROPLASTY (Right) Sigma RP  SURGEON:  Surgeon(s) and Role:    Netta Cedars, MD - Primary  PHYSICIAN ASSISTANT:   ASSISTANTS: [facility] Bruns, PA-C   ANESTHESIA:   regional and spinal  EBL:  25 mL   BLOOD ADMINISTERED:none  DRAINS: none   LOCAL MEDICATIONS USED:  NONE  SPECIMEN:  No Specimen  DISPOSITION OF SPECIMEN:  N/A  COUNTS:  YES  TOURNIQUET:  1 hour 20 minutes  DICTATION: .Other Dictation: Dictation Number [id number]  PLAN OF CARE: Admit to inpatient   PATIENT DISPOSITION:  PACU - hemodynamically stable.   Delay start of Pharmacological VTE agent (>24hrs) due to surgical blood loss or risk of bleeding: no

## 2019-02-06 NOTE — Transfer of Care (Signed)
Immediate Anesthesia Transfer of Care Note  Patient: Darin Harris  Procedure(s) Performed: TOTAL KNEE ARTHROPLASTY (Right )  Patient Location: PACU  Anesthesia Type:Spinal  Level of Consciousness: awake  Airway & Oxygen Therapy: Patient Spontanous Breathing and Patient connected to face mask oxygen  Post-op Assessment: Report given to RN and Post -op Vital signs reviewed and stable  Post vital signs: Reviewed and stable  Last Vitals:  Vitals Value Taken Time  BP 94/54 02/06/19 1411  Temp    Pulse 66 02/06/19 1412  Resp 16 02/06/19 1412  SpO2 97 % 02/06/19 1412  Vitals shown include unvalidated device data.  Last Pain:  Vitals:   02/06/19 1127  TempSrc:   PainSc: 0-No pain         Complications: No apparent anesthesia complications

## 2019-02-07 ENCOUNTER — Encounter (HOSPITAL_COMMUNITY): Payer: Self-pay | Admitting: Orthopedic Surgery

## 2019-02-07 DIAGNOSIS — M1711 Unilateral primary osteoarthritis, right knee: Secondary | ICD-10-CM | POA: Diagnosis not present

## 2019-02-07 LAB — BASIC METABOLIC PANEL
Anion gap: 6 (ref 5–15)
BUN: 14 mg/dL (ref 6–20)
CO2: 25 mmol/L (ref 22–32)
Calcium: 8.4 mg/dL — ABNORMAL LOW (ref 8.9–10.3)
Chloride: 105 mmol/L (ref 98–111)
Creatinine, Ser: 0.85 mg/dL (ref 0.61–1.24)
GFR calc Af Amer: >60 mL/min (ref >60)
GFR calc non Af Amer: >60 mL/min (ref >60)
Glucose, Bld: 126 mg/dL — ABNORMAL HIGH (ref 70–99)
Potassium: 4.2 mmol/L (ref 3.5–5.1)
Sodium: 136 mmol/L (ref 135–145)

## 2019-02-07 LAB — CBC
HCT: 42.8 % (ref 39.0–52.0)
Hemoglobin: 14.2 g/dL (ref 13.0–17.0)
MCH: 29.1 pg (ref 26.0–34.0)
MCHC: 33.2 g/dL (ref 30.0–36.0)
MCV: 87.7 fL (ref 80.0–100.0)
Platelets: 259 10*3/uL (ref 150–400)
RBC: 4.88 MIL/uL (ref 4.22–5.81)
RDW: 12 % (ref 11.5–15.5)
WBC: 13.7 10*3/uL — ABNORMAL HIGH (ref 4.0–10.5)
nRBC: 0 % (ref 0.0–0.2)

## 2019-02-07 LAB — GLUCOSE, CAPILLARY
Glucose-Capillary: 130 mg/dL — ABNORMAL HIGH (ref 70–99)
Glucose-Capillary: 109 mg/dL — ABNORMAL HIGH (ref 70–99)

## 2019-02-07 NOTE — Progress Notes (Signed)
The pt was provided with d/c instructions. After discussing the pt's plan of care upon d/c home, the pt reported no further questions or concerns.  The pt has one more exercise session with PT before the pt is able to d/c.

## 2019-02-07 NOTE — Progress Notes (Signed)
Orthopedics Progress Note  Subjective: Patient comfortable this AM  Objective:  Vitals:   02/07/19 0123 02/07/19 0541  BP: 119/72 113/73  Pulse: 73 68  Resp: 17 16  Temp: 98.5 F (36.9 C) 98.9 F (37.2 C)  SpO2: 95% 97%    General: Awake and alert  Musculoskeletal: Right knee dressing CDI. Neg Homans Neurovascularly intact  Lab Results  Component Value Date   WBC 13.7 (H) 02/07/2019   HGB 14.2 02/07/2019   HCT 42.8 02/07/2019   MCV 87.7 02/07/2019   PLT 259 02/07/2019       Component Value Date/Time   NA 136 02/07/2019 0305   K 4.2 02/07/2019 0305   CL 105 02/07/2019 0305   CO2 25 02/07/2019 0305   GLUCOSE 126 (H) 02/07/2019 0305   BUN 14 02/07/2019 0305   CREATININE 0.85 02/07/2019 0305   CALCIUM 8.4 (L) 02/07/2019 0305   GFRNONAA >60 02/07/2019 0305   GFRAA >60 02/07/2019 0305    No results found for: INR, PROTIME  Assessment/Plan: POD #1 s/p Procedure(s): TOTAL KNEE ARTHROPLASTY Up with therapy. Discharge to home Home health PT for one week then outpatient therapy at our office WBAT DVT prophylaxis  Doran Heater. Veverly Fells, MD 02/07/2019 7:50 AM

## 2019-02-07 NOTE — TOC Transition Note (Addendum)
Transition of Care [facility]) - CM/SW Discharge Note   Patient Details  Name: Darin Harris MRN: [id number] Date of Birth: [birth year]  Transition of Care [facility]) CM/SW Contact:  Lia Hopping, [place] Phone Number: 02/07/2019, 9:52 AM   Clinical Narrative:    Home Health orders acknowledged. [facility] arranged.  Patient does not have DME. DME (rolling walker, 3 in1) ordered through [facility] inform nurse patient will need physician orders for the DME   Final next level of care: [place] Barriers to Discharge: No Barriers Identified   Patient Goals and CMS Choice Patient states their goals for this hospitalization and ongoing recovery are:: Home CMS Medicare.gov Compare Post Acute Care list provided to:: Patient Choice offered to / list presented to : Patient  Discharge Placement                       Discharge Plan and Services                DME Arranged: 3-N-1, Walker rolling DME Agency: [employer] Date DME Agency Contacted: 02/07/19 Time DME Agency Contacted: [phone] Representative spoke with at DME Agency: [place]: PT, OT [place] Agency: [facility] (formerly [employer]) Date [place]: 02/07/19 Time [place]: [phone] Representative spoke with at [place]: [place] ([place]) Interventions     Readmission Risk Interventions No flowsheet data found.

## 2019-02-07 NOTE — Progress Notes (Signed)
Physical Therapy Treatment Patient Details Name: Darin Harris MRN: [id number] DOB: 03/02/1966 Today's Date: 02/07/2019    History of Present Illness s/p R TKA. PMH: HTN, DM    PT Comments    Pt progressing well. Will see for a second session to review HEP   Follow Up Recommendations  Follow surgeon's recommendation for DC plan and follow-up therapies     Equipment Recommendations  Rolling walker with 5" wheels    Recommendations for Other Services       Precautions / Restrictions Precautions Precautions: Fall;Knee Required Braces or Orthoses: Knee Immobilizer - Right Restrictions Weight Bearing Restrictions: No Other Position/Activity Restrictions: WBAT    Mobility  Bed Mobility Overal bed mobility: Needs Assistance Bed Mobility: Supine to Sit     Supine to sit: Min guard     General bed mobility comments: instructe in use of gait belt as leg lifter   Transfers Overall transfer level: Needs assistance Equipment used: Rolling walker (2 wheeled) Transfers: Sit to/from Stand Sit to Stand: Min guard         General transfer comment: cues for hand placement and RLE position  Ambulation/Gait Ambulation/Gait assistance: Min guard;Supervision Gait Distance (Feet): 160 Feet Assistive device: Rolling walker (2 wheeled) Gait Pattern/deviations: Step-to pattern Gait velocity: decr   General Gait Details: cues for sequence and wt shift onto  UEs for pain control RLE   Stairs Stairs: Yes Stairs assistance: Min guard Stair Management: Two rails;Step to pattern;Forwards Number of Stairs: 3 General stair comments: cues for sequence and technique   Wheelchair Mobility    Modified Rankin (Stroke Patients Only)       Balance                                            Cognition Arousal/Alertness: Awake/alert Behavior During Therapy: WFL for tasks assessed/performed Overall Cognitive Status: Within Functional Limits for tasks  assessed                                        Exercises Total Joint Exercises Ankle Circles/Pumps: AROM;Both;10 reps Heel Slides: AROM;Right;10 reps    General Comments        Pertinent Vitals/Pain Pain Assessment: 0-10 Pain Score: 5  Pain Location: right knee Pain Descriptors / Indicators: Discomfort;Grimacing;Guarding Pain Intervention(s): Limited activity within patient's tolerance;Monitored during session;Premedicated before session;Repositioned;Ice applied    Home Living                      Prior Function            PT Goals (current goals can now be found in the care plan section) Acute Rehab PT Goals PT Goal Formulation: With patient Time For Goal Achievement: 02/13/19 Potential to Achieve Goals: Good Progress towards PT goals: Progressing toward goals    Frequency    7X/week      PT Plan Current plan remains appropriate    Co-evaluation              AM-PAC PT "6 Clicks" Mobility   Outcome Measure  Help needed turning from your back to your side while in a flat bed without using bedrails?: A Little Help needed moving from lying on your back to sitting on the side of a flat bed  without using bedrails?: A Little Help needed moving to and from a bed to a chair (including a wheelchair)?: A Little Help needed standing up from a chair using your arms (e.g., wheelchair or bedside chair)?: A Little Help needed to walk in hospital room?: A Little Help needed climbing 3-5 steps with a railing? : A Little 6 Click Score: 18    End of Session Equipment Utilized During Treatment: Gait belt Activity Tolerance: Patient tolerated treatment well Patient left: in chair;with chair alarm set;with call bell/phone within reach Nurse Communication: Mobility status PT Visit Diagnosis: Difficulty in walking, not elsewhere classified (R26.2)     Time: 9024-0973 PT Time Calculation (min) (ACUTE ONLY): 28 min  Charges:  $Gait Training:  23-37 mins                     Kenyon Ana, PT  Pager: [phone] Acute Rehab Dept [facility]): [phone]   02/07/2019    [facility] 02/07/2019, 10:45 AM

## 2019-02-07 NOTE — Discharge Summary (Signed)
Orthopedic Discharge Summary        Physician Discharge Summary  Patient ID: Darin Harris MRN: [id number] DOB/AGE: [birth year] 53 y.o.  Admit date: 02/06/2019 Discharge date: 02/07/2019   Procedures:  Procedure(s) (LRB): TOTAL KNEE ARTHROPLASTY (Right)  Attending Physician:  Dr. Esmond Plants  Admission Diagnoses:   Right knee end stage OA  Discharge Diagnoses:  same   Past Medical History:  Diagnosis Date  . Arthritis   . Complication of anesthesia    Vertigo  . Diabetes mellitus without complication ([place])   . Headache   . Heart murmur   . Hypertension     PCP: Bernerd Limbo, MD   Discharged Condition: good  Hospital Course:  Patient underwent the above stated procedure on 02/06/2019. Patient tolerated the procedure well and brought to the recovery room in good condition and subsequently to the floor. Patient had an uncomplicated hospital course and was stable for discharge.   Disposition: Discharge disposition: 01-Home or Self Care      with follow up in 2 weeks   Follow-up Information    Netta Cedars, MD. Call in 2 weeks.   Specialty: Orthopedic Surgery Why: [phone] Contact information: [address] [place] [address] [phone]           Discharge Instructions    Call MD / Call 911   Complete by: As directed    If you experience chest pain or shortness of breath, CALL 911 and be transported to the hospital emergency room.  If you develope a fever above 101 F, pus (white drainage) or increased drainage or redness at the wound, or calf pain, call your surgeon's office.   Constipation Prevention   Complete by: As directed    Drink plenty of fluids.  Prune juice may be helpful.  You may use a stool softener, such as Colace (over the counter) 100 mg twice a day.  Use MiraLax (over the counter) for constipation as needed.   Diet - low sodium heart healthy   Complete by: As directed    Increase activity slowly as  tolerated   Complete by: As directed       Allergies as of 02/07/2019      Reactions   Oxycodone Nausea And Vomiting      Medication List    TAKE these medications   aspirin 81 MG chewable tablet Commonly known as: Aspirin Childrens Chew 1 tablet (81 mg total) by mouth 2 (two) times a day.   metFORMIN 500 MG 24 hr tablet Commonly known as: GLUCOPHAGE-XR Take 500 mg by mouth 2 (two) times a day.   methocarbamol 500 MG tablet Commonly known as: Robaxin Take 1 tablet (500 mg total) by mouth every 6 (six) hours as needed for muscle spasms.   ondansetron 4 MG tablet Commonly known as: Zofran Take 1 tablet (4 mg total) by mouth every 8 (eight) hours as needed for nausea or vomiting.   oxyCODONE-acetaminophen 5-325 MG tablet Commonly known as: Percocet Take 1-2 tablets by mouth every 4 (four) hours as needed for moderate pain or severe pain.         Signed: Augustin Schooling 02/07/2019, 7:52 AM  [facility] is now [employer] [facility] [address] [place], [place], [place] [address] Phone: [place]

## 2019-02-07 NOTE — Progress Notes (Addendum)
   02/07/19 1200  PT Visit Information  Last PT Received On 02/07/19 pt continues to progress. Exercise focused session. Ready for d/c form PT standpoint  Assistance Needed +1  History of Present Illness s/p R TKA. PMH: HTN, DM  Precautions  Precautions Fall;Knee  Required Braces or Orthoses Knee Immobilizer - Right  Restrictions  Other Position/Activity Restrictions WBAT  Pain Assessment  Pain Assessment 0-10  Pain Score 5  Pain Location right knee  Pain Descriptors / Indicators Discomfort;Grimacing;Guarding  Pain Intervention(s) Limited activity within patient's tolerance;Monitored during session;Premedicated before session;Repositioned;Ice applied  Cognition  Arousal/Alertness Awake/alert  Behavior During Therapy WFL for tasks assessed/performed  Overall Cognitive Status Within Functional Limits for tasks assessed  Total Joint Exercises  Ankle Circles/Pumps AROM;Both;10 reps  Quad Sets AROM;Both;10 reps  Heel Slides AROM;Right;10 reps  Hip ABduction/ADduction AROM;Right;10 reps;AAROM  Straight Leg Raises AAROM;AROM;Right;10 reps  Goniometric ROM grossly 10* to 50* AAROM right knee fleixon  Knee Flexion AROM;AAROM;Right;5 reps;Seated  PT - End of Session  Activity Tolerance Patient tolerated treatment well;Patient limited by pain  Patient left in chair;with chair alarm set;with call bell/phone within reach  Nurse Communication Mobility status   PT - Assessment/Plan  PT Plan Current plan remains appropriate  PT Visit Diagnosis Difficulty in walking, not elsewhere classified (R26.2)  PT Frequency (ACUTE ONLY) 7X/week  Follow Up Recommendations Follow surgeon's recommendation for DC plan and follow-up therapies  PT equipment Rolling walker with 5" wheels  AM-PAC PT "6 Clicks" Mobility Outcome Measure (Version 2)  Help needed turning from your back to your side while in a flat bed without using bedrails? 3  Help needed moving from lying on your back to sitting on the side of a  flat bed without using bedrails? 3  Help needed moving to and from a bed to a chair (including a wheelchair)? 3  Help needed standing up from a chair using your arms (e.g., wheelchair or bedside chair)? 3  Help needed to walk in hospital room? 4  Help needed climbing 3-5 steps with a railing?  3  6 Click Score 19  Consider Recommendation of Discharge To: Home with [facility]  PT Goal Progression  Progress towards PT goals Progressing toward goals  Acute Rehab PT Goals  PT Goal Formulation With patient  Time For Goal Achievement 02/13/19  Potential to Achieve Goals Good  PT Time Calculation  PT Start Time (ACUTE ONLY) 1211  PT Stop Time (ACUTE ONLY) 1230  PT Time Calculation (min) (ACUTE ONLY) 19 min  PT General Charges  $$ ACUTE PT VISIT 1 Visit  PT Treatments  $Therapeutic Exercise 8-22 mins

## 2019-02-07 NOTE — Discharge Instructions (Signed)
Ice to the knee constantly. Change bandage to gel bandage on Thursday. Change gel bandage every 4 days.  Keep the incision covered and clean and dry for one week, then ok to get it wet in the shower. No baths, no pool or hot tub for three weeks.   Do exercise as instructed several times per day. You may place full weight on the right leg.  Wear your black brace at night while sleeping to keep your knee straight.   DO NOT prop anything behind the knee to rest the knee.  Prop under the heel or ankle to help with getting your knee straight.  Dangle the knee over the bed to work on flexion.    Follow up with Dr Veverly Fells in two weeks in the office, call [phone] for appt

## 2019-10-08 ENCOUNTER — Ambulatory Visit: Payer: BC Managed Care – PPO | Attending: Internal Medicine

## 2019-10-08 DIAGNOSIS — Z23 Encounter for immunization: Secondary | ICD-10-CM

## 2019-10-08 NOTE — Progress Notes (Signed)
   Covid-19 Vaccination Clinic  Name:  Darin Harris    MRN: [id number] DOB: January 11, 1966  10/08/2019  Mr. Vanderheiden was observed post Covid-19 immunization for 15 minutes without incident. He was provided with Vaccine Information Sheet and instruction to access the V-Safe system.   Mr. Chura was instructed to call 911 with any severe reactions post vaccine: Marland Kitchen Difficulty breathing  . Swelling of face and throat  . A fast heartbeat  . A bad rash all over body  . Dizziness and weakness   Immunizations Administered    Name Date Dose VIS Date Route   Pfizer COVID-19 Vaccine 10/08/2019  6:32 PM 0.3 mL 07/14/2019 Intramuscular   Manufacturer: [employer], [employer]   Lot: [id number]   NDC: [address]-5

## 2019-10-29 ENCOUNTER — Ambulatory Visit: Payer: BC Managed Care – PPO | Attending: Internal Medicine

## 2019-10-29 DIAGNOSIS — Z23 Encounter for immunization: Secondary | ICD-10-CM

## 2019-10-29 NOTE — Progress Notes (Signed)
   Covid-19 Vaccination Clinic  Name:  BERYL BALZ    MRN: [id number] DOB: [birth year]  10/29/2019  Mr. Shawler was observed post Covid-19 immunization for 15 minutes without incident. He was provided with Vaccine Information Sheet and instruction to access the V-Safe system.   Mr. Staver was instructed to call 911 with any severe reactions post vaccine: Marland Kitchen Difficulty breathing  . Swelling of face and throat  . A fast heartbeat  . A bad rash all over body  . Dizziness and weakness   Immunizations Administered    Name Date Dose VIS Date Route   Pfizer COVID-19 Vaccine 10/29/2019  3:10 PM 0.3 mL 07/14/2019 Intramuscular   Manufacturer: [employer], [employer]   Lot: [id number]   NDC: [address]-7

## 2020-10-27 IMAGING — DX PORTABLE RIGHT KNEE - 1-2 VIEW
2 series · 2 of 2 positions shown · non-contrast
Comparison: None.

CLINICAL DATA: Status post right knee replacement

EXAM:
PORTABLE RIGHT KNEE - 1-2 VIEW

[knee ap]
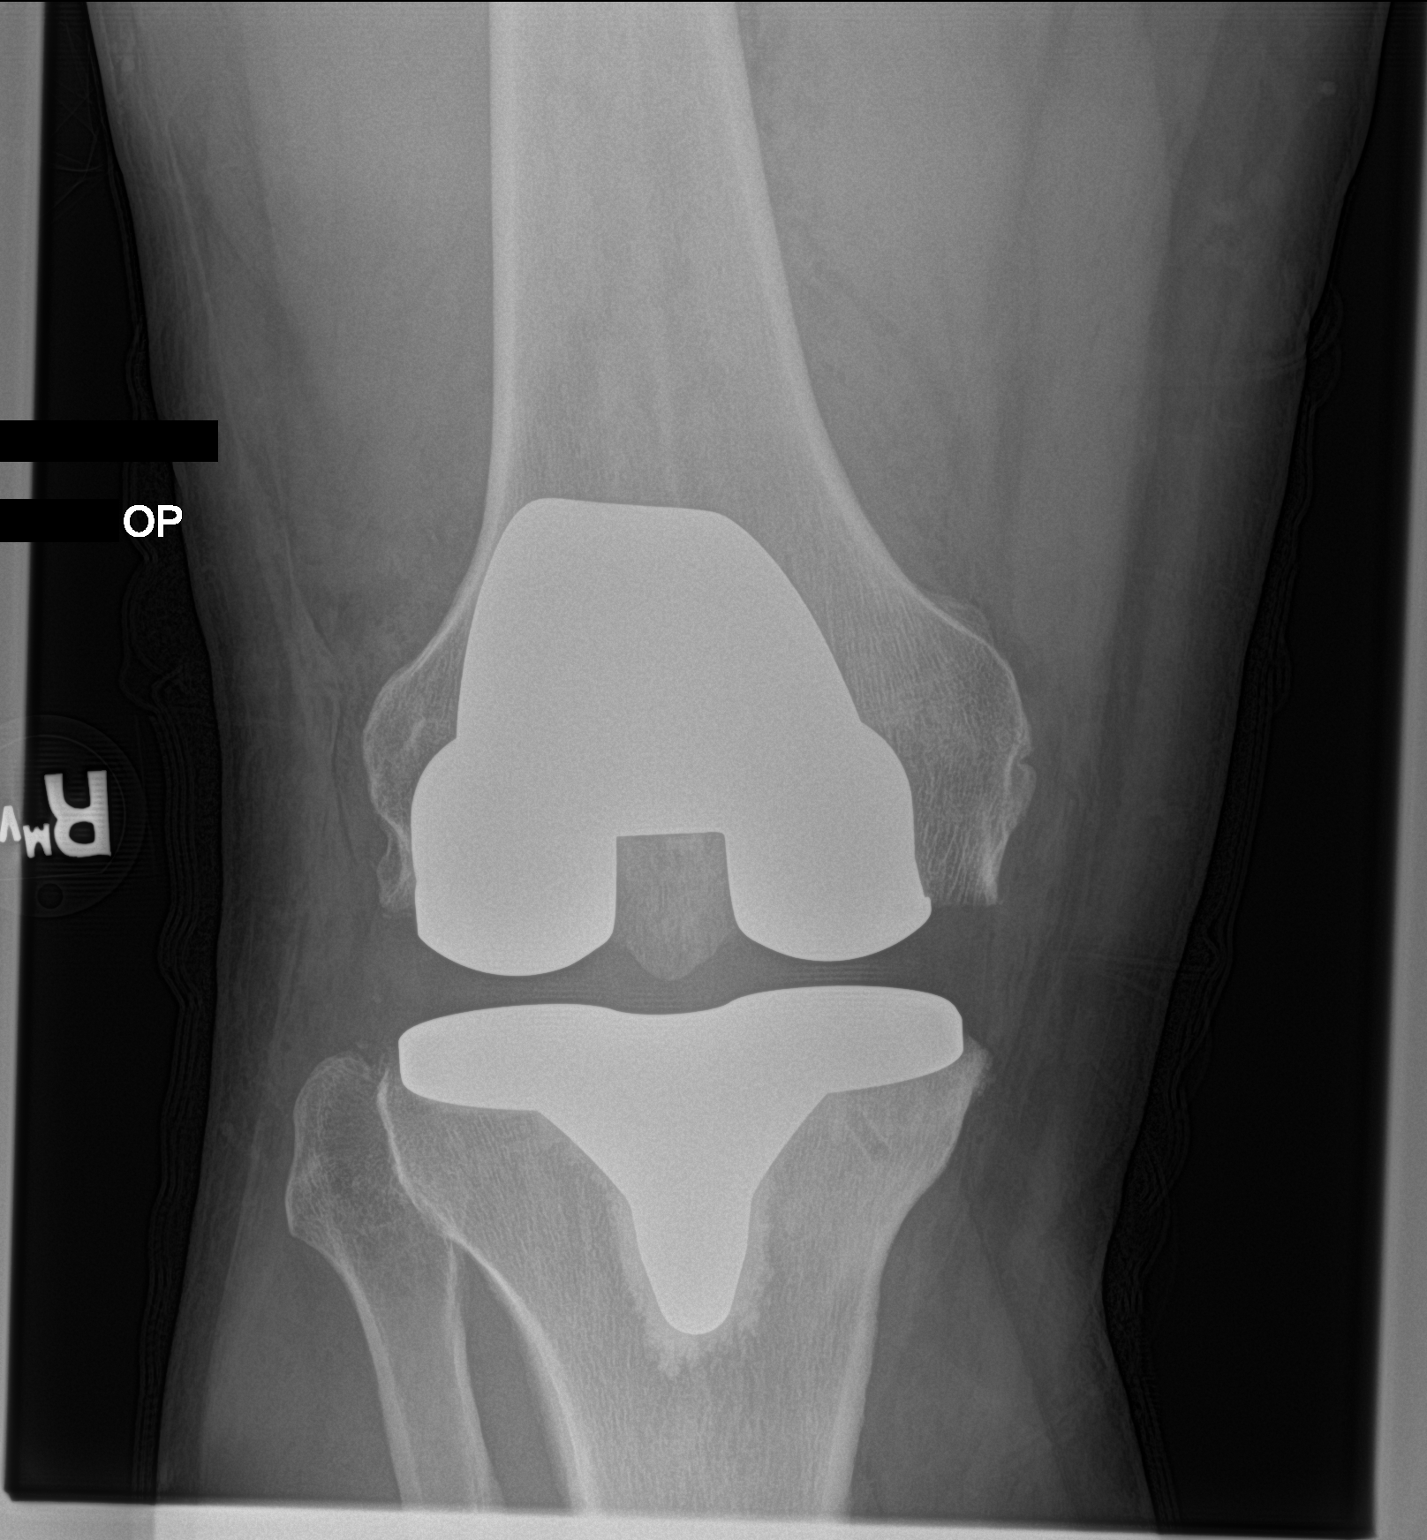

[knee lat]
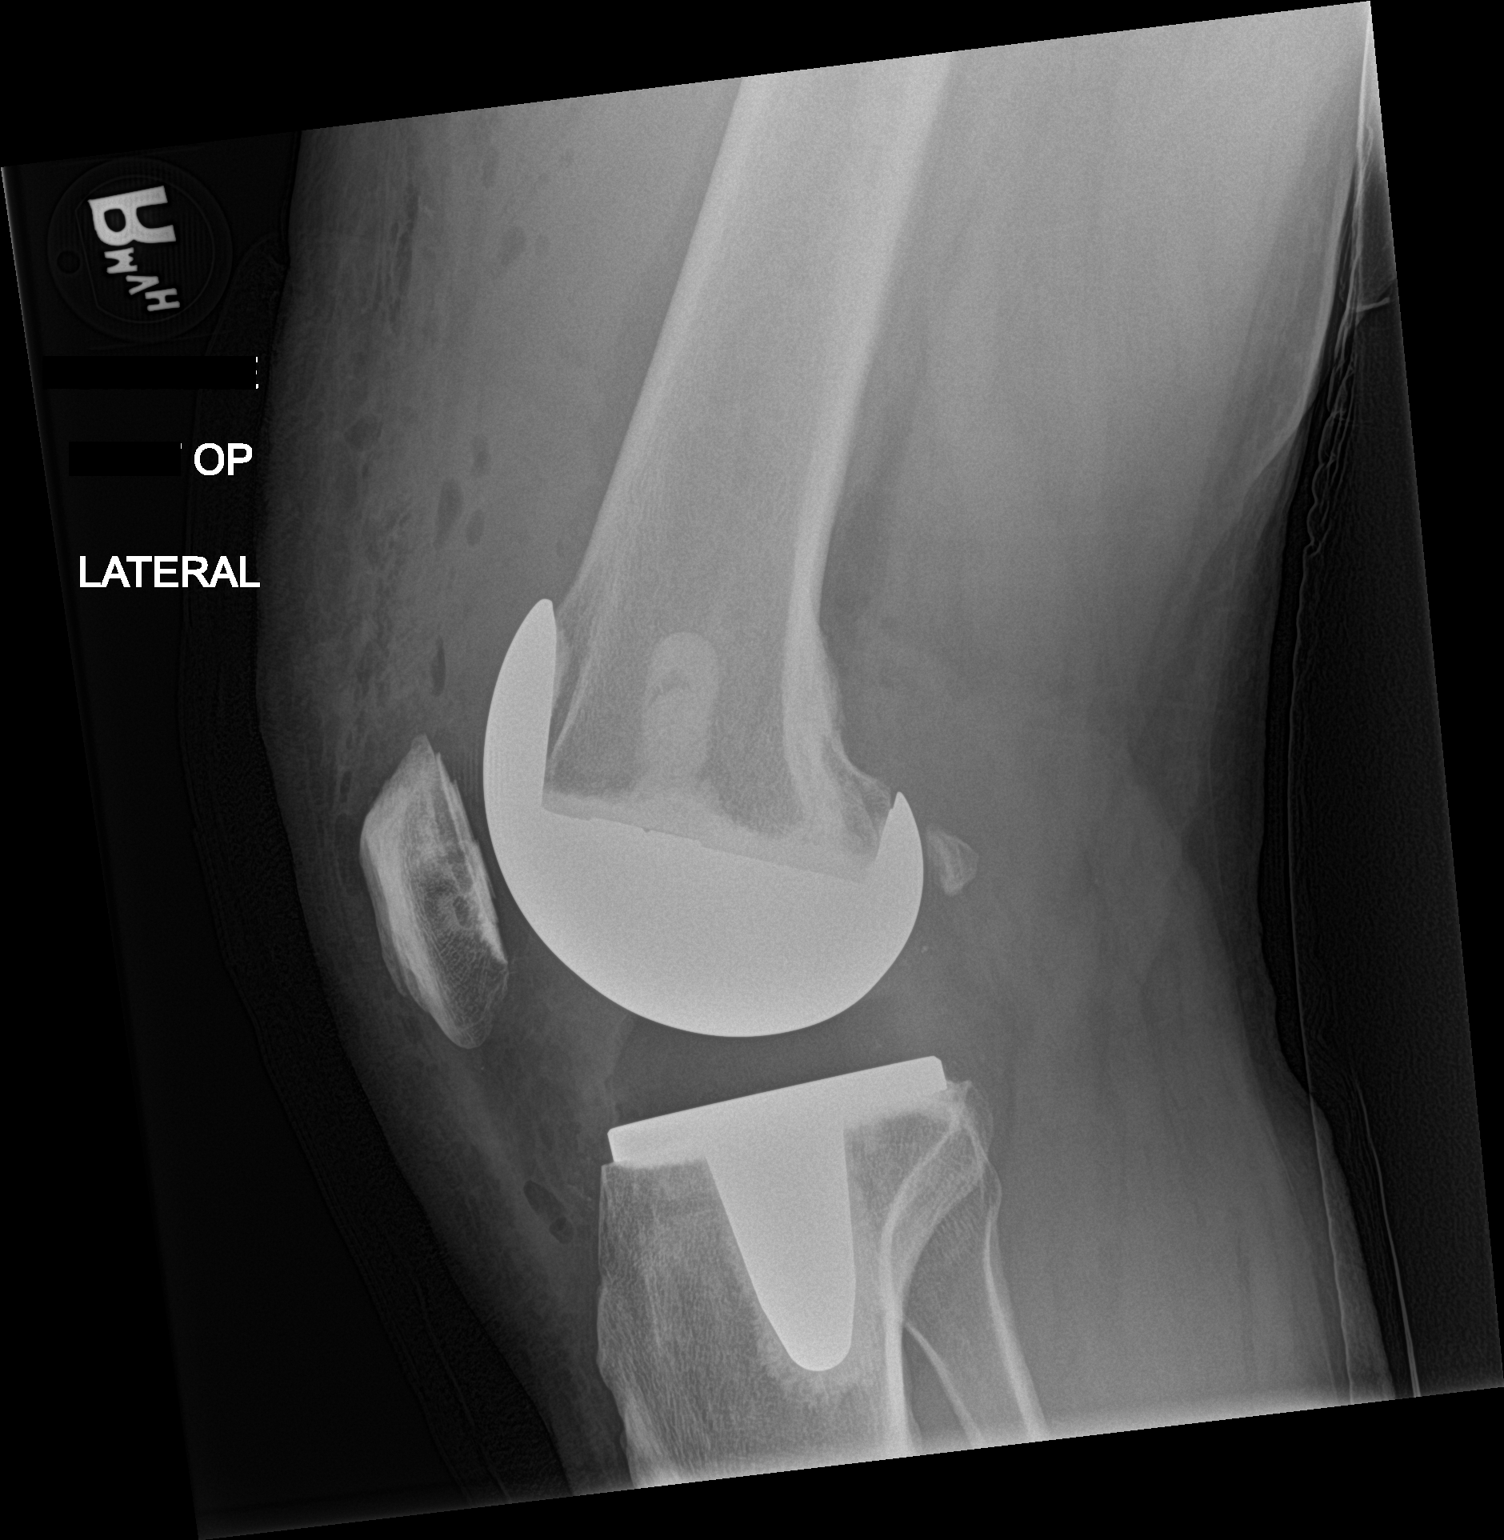

[2 of 2 positions shown; findings below may reference images not displayed]

FINDINGS: Right knee prosthesis is noted in satisfactory position. No acute
bony or soft tissue abnormality is noted.
IMPRESSION: Status post right knee replacement

## 2023-07-07 ENCOUNTER — Other Ambulatory Visit (HOSPITAL_BASED_OUTPATIENT_CLINIC_OR_DEPARTMENT_OTHER): Payer: Self-pay

## 2023-07-07 MED ORDER — MOUNJARO 2.5 MG/0.5ML ~~LOC~~ SOAJ
2.5000 mg | SUBCUTANEOUS | 11 refills | Status: AC
  Filled 2023-07-07: qty 2, 28d supply, fill #0
  Filled 2023-07-31 – 2023-08-09 (×2): qty 2, 28d supply, fill #1
  Filled 2023-10-06: qty 2, 28d supply, fill #3
  Filled 2023-10-24 – 2023-11-01 (×3): qty 2, 28d supply, fill #4
  Filled 2023-12-01: qty 2, 28d supply, fill #5
  Filled 2023-12-24 – 2023-12-28 (×2): qty 2, 28d supply, fill #6
  Filled 2024-01-22: qty 2, 28d supply, fill #7
  Filled 2024-02-19: qty 2, 28d supply, fill #8

## 2023-07-31 ENCOUNTER — Other Ambulatory Visit (HOSPITAL_BASED_OUTPATIENT_CLINIC_OR_DEPARTMENT_OTHER): Payer: Self-pay

## 2023-08-09 ENCOUNTER — Other Ambulatory Visit (HOSPITAL_BASED_OUTPATIENT_CLINIC_OR_DEPARTMENT_OTHER): Payer: Self-pay

## 2023-08-09 ENCOUNTER — Encounter (HOSPITAL_BASED_OUTPATIENT_CLINIC_OR_DEPARTMENT_OTHER): Payer: Self-pay

## 2023-09-09 ENCOUNTER — Other Ambulatory Visit (HOSPITAL_BASED_OUTPATIENT_CLINIC_OR_DEPARTMENT_OTHER): Payer: Self-pay

## 2023-10-25 ENCOUNTER — Other Ambulatory Visit (HOSPITAL_BASED_OUTPATIENT_CLINIC_OR_DEPARTMENT_OTHER): Payer: Self-pay

## 2023-12-24 ENCOUNTER — Other Ambulatory Visit (HOSPITAL_BASED_OUTPATIENT_CLINIC_OR_DEPARTMENT_OTHER): Payer: Self-pay

## 2023-12-30 ENCOUNTER — Other Ambulatory Visit (HOSPITAL_BASED_OUTPATIENT_CLINIC_OR_DEPARTMENT_OTHER): Payer: Self-pay

## 2024-02-29 ENCOUNTER — Other Ambulatory Visit (HOSPITAL_BASED_OUTPATIENT_CLINIC_OR_DEPARTMENT_OTHER): Payer: Self-pay
